# Patient Record
Sex: Female | Born: 1965 | Race: Black or African American | Hispanic: No | Marital: Married | Smoking: Never smoker
Health system: Southern US, Community
[De-identification: ages and names within clinical notes are randomized; demographics above are authoritative.]

## PROBLEM LIST (undated history)

## (undated) DIAGNOSIS — I10 Essential (primary) hypertension: Secondary | ICD-10-CM

## (undated) DIAGNOSIS — T7840XA Allergy, unspecified, initial encounter: Secondary | ICD-10-CM

## (undated) HISTORY — DX: Essential (primary) hypertension: I10

## (undated) HISTORY — DX: Allergy, unspecified, initial encounter: T78.40XA

---

## 1997-09-01 ENCOUNTER — Inpatient Hospital Stay (HOSPITAL_COMMUNITY): Admission: RE | Admit: 1997-09-01 | Discharge: 1997-09-01 | Payer: Self-pay | Admitting: Obstetrics and Gynecology

## 1997-10-30 ENCOUNTER — Other Ambulatory Visit: Admission: RE | Admit: 1997-10-30 | Discharge: 1997-10-30 | Payer: Self-pay | Admitting: Obstetrics & Gynecology

## 1997-12-27 ENCOUNTER — Other Ambulatory Visit: Admission: RE | Admit: 1997-12-27 | Discharge: 1997-12-27 | Payer: Self-pay | Admitting: Obstetrics and Gynecology

## 1998-01-08 ENCOUNTER — Inpatient Hospital Stay (HOSPITAL_COMMUNITY): Admission: AD | Admit: 1998-01-08 | Discharge: 1998-01-10 | Payer: Self-pay | Admitting: Obstetrics and Gynecology

## 1998-03-04 ENCOUNTER — Encounter (HOSPITAL_COMMUNITY): Admission: RE | Admit: 1998-03-04 | Discharge: 1998-06-02 | Payer: Self-pay | Admitting: Obstetrics and Gynecology

## 1998-06-11 ENCOUNTER — Encounter (HOSPITAL_COMMUNITY): Admission: RE | Admit: 1998-06-11 | Discharge: 1998-09-09 | Payer: Self-pay | Admitting: *Deleted

## 1998-10-03 ENCOUNTER — Encounter (HOSPITAL_COMMUNITY): Admission: RE | Admit: 1998-10-03 | Discharge: 1999-01-01 | Payer: Self-pay | Admitting: *Deleted

## 2002-08-26 ENCOUNTER — Emergency Department (HOSPITAL_COMMUNITY): Admission: EM | Admit: 2002-08-26 | Discharge: 2002-08-26 | Payer: Self-pay | Admitting: Emergency Medicine

## 2009-04-17 ENCOUNTER — Emergency Department (HOSPITAL_COMMUNITY): Admission: EM | Admit: 2009-04-17 | Discharge: 2009-04-17 | Payer: Self-pay | Admitting: Family Medicine

## 2009-04-17 ENCOUNTER — Emergency Department (HOSPITAL_COMMUNITY): Admission: EM | Admit: 2009-04-17 | Discharge: 2009-04-18 | Payer: Self-pay | Admitting: Emergency Medicine

## 2011-05-18 ENCOUNTER — Other Ambulatory Visit: Payer: Self-pay | Admitting: Internal Medicine

## 2011-05-18 DIAGNOSIS — Z1231 Encounter for screening mammogram for malignant neoplasm of breast: Secondary | ICD-10-CM

## 2013-02-12 ENCOUNTER — Other Ambulatory Visit: Payer: Self-pay | Admitting: Obstetrics and Gynecology

## 2013-02-12 DIAGNOSIS — Z1231 Encounter for screening mammogram for malignant neoplasm of breast: Secondary | ICD-10-CM

## 2013-02-27 ENCOUNTER — Ambulatory Visit: Payer: Self-pay

## 2013-10-08 ENCOUNTER — Ambulatory Visit (INDEPENDENT_AMBULATORY_CARE_PROVIDER_SITE_OTHER): Payer: BC Managed Care – PPO | Admitting: Physician Assistant

## 2013-10-08 VITALS — BP 156/98 | HR 88 | Temp 98.3°F | Resp 18 | Ht 67.5 in | Wt 165.4 lb

## 2013-10-08 DIAGNOSIS — H6120 Impacted cerumen, unspecified ear: Secondary | ICD-10-CM

## 2013-10-08 DIAGNOSIS — H9202 Otalgia, left ear: Secondary | ICD-10-CM

## 2013-10-08 DIAGNOSIS — H9209 Otalgia, unspecified ear: Secondary | ICD-10-CM

## 2013-10-08 MED ORDER — HYDROCODONE-ACETAMINOPHEN 5-325 MG PO TABS: 1.0000 | ORAL_TABLET | Freq: Four times a day (QID) | ORAL | Status: DC | PRN

## 2013-10-08 NOTE — Progress Notes (Signed)
   Subjective:    Patient ID: Jo Patterson, female    DOB: Sep 25, 1965, 48 y.o.   MRN: [id number]  HPI 48 year old female presents for evaluation of left ear pain. Symptoms started about 1 week ago and got acutely worse last night.  She has had hx of cerumen impaction in the past but it was "years" ago.  States she has felt like her ear has been "closing up" and she has also had decreased hearing.  Hx of ear infections as a child.  She has been using debrox and trying to irrigate her ear but she has had no relief.  Last night the pain increased significantly.   Denies nasal congestion, sore throat, dizziness, nausea, vomiting, or tinnitus.   She is otherwise doing well with no other concerns today.     Review of Systems  Constitutional: Negative for fever and chills.  HENT: Positive for ear pain (left). Negative for congestion, postnasal drip, rhinorrhea, sinus pressure, sore throat and tinnitus.   Respiratory: Negative for cough.   Gastrointestinal: Negative for nausea and vomiting.  Neurological: Negative for light-headedness and headaches.       Objective:   Physical Exam  Constitutional: She is oriented to person, place, and time. She appears well-developed and well-nourished.  HENT:  Head: Normocephalic and atraumatic.  Right Ear: Hearing, external ear and ear canal normal.  Left Ear: External ear and ear canal normal. Decreased hearing is noted.  Bilateral cerumen impaction. Normal TM on right visualized s/p irrigation.  Unable to removed wax in left ear  Eyes: Conjunctivae are normal.  Neck: Normal range of motion.  Cardiovascular: Normal rate.   Pulmonary/Chest: Effort normal and breath sounds normal.  Neurological: She is alert and oriented to person, place, and time.  Psychiatric: She has a normal mood and affect. Her behavior is normal. Judgment and thought content normal.          Assessment & Plan:  Otalgia of left ear - Plan: HYDROcodone-acetaminophen  (NORCO) 5-325 MG per tablet  Cerumen impaction  Left ear was irrigated for approximately 30 minutes and wax remained in canal. Will send patient home with colace to use tonight and tomorrow morning. Instructed her to RTC in the morning for repeat irrigation attempt. If unsuccessful will refer to ENT.

## 2013-10-08 NOTE — Patient Instructions (Signed)
Discontinue use of Q-tips, hair pins, keys, etc.   To help prevent cerumen impaction: While in the washing hair in the shower, allow soapy water to run into ear canal for a few minutes and then rinse (it dissolves the wax like a dishwasher dissolves grease).   May also use half hydrogen peroxide half water in the shower 2-3 times per week to help rinse out the wax. DO NOT USE 100% HYDROGEN PEROXIDE (this can burn the skin).   Also, several drops of sweet oil can be used to help prevent itching of the canal. If this is not enough to decrease itch, you may put a small amount of OTC hydrocortisone cream on pinky finger and apply to portion of canal that can be reached with tip of finger.  Recommend OTC Debrox or Colace to prevent impaction.  

## 2013-10-09 ENCOUNTER — Ambulatory Visit (INDEPENDENT_AMBULATORY_CARE_PROVIDER_SITE_OTHER): Payer: BC Managed Care – PPO | Admitting: Physician Assistant

## 2013-10-09 DIAGNOSIS — H6120 Impacted cerumen, unspecified ear: Secondary | ICD-10-CM

## 2013-10-09 DIAGNOSIS — H9202 Otalgia, left ear: Secondary | ICD-10-CM

## 2013-10-09 DIAGNOSIS — H9209 Otalgia, unspecified ear: Secondary | ICD-10-CM

## 2013-10-09 NOTE — Progress Notes (Signed)
Patient returned this morning for repeat irrigation of left ear after unsuccessful attempt last night. She used colace last night once she got home and again this morning.  Left ear irrigated successfully today and patient reports resolution of symptoms.  No pain, dizziness, or drainage from her ear.  On exam her TM is normal and she has no erythema or debris in the canal. Ear care instructions provided. Follow up as needed.

## 2013-10-24 ENCOUNTER — Ambulatory Visit (INDEPENDENT_AMBULATORY_CARE_PROVIDER_SITE_OTHER): Payer: BC Managed Care – PPO | Admitting: Physician Assistant

## 2013-10-24 VITALS — BP 142/96 | HR 74 | Temp 99.4°F | Resp 16 | Ht 67.5 in | Wt 169.6 lb

## 2013-10-24 DIAGNOSIS — R059 Cough, unspecified: Secondary | ICD-10-CM

## 2013-10-24 DIAGNOSIS — J988 Other specified respiratory disorders: Secondary | ICD-10-CM

## 2013-10-24 DIAGNOSIS — R05 Cough: Secondary | ICD-10-CM

## 2013-10-24 DIAGNOSIS — J22 Unspecified acute lower respiratory infection: Secondary | ICD-10-CM

## 2013-10-24 MED ORDER — AZITHROMYCIN 250 MG PO TABS: ORAL_TABLET | ORAL | Status: DC

## 2013-10-24 MED ORDER — HYDROCODONE-HOMATROPINE 5-1.5 MG/5ML PO SYRP: 5.0000 mL | ORAL_SOLUTION | Freq: Three times a day (TID) | ORAL | Status: DC | PRN

## 2013-10-24 MED ORDER — GUAIFENESIN ER 1200 MG PO TB12: 1.0000 | ORAL_TABLET | Freq: Two times a day (BID) | ORAL | Status: DC | PRN

## 2013-10-24 NOTE — Progress Notes (Signed)
   Subjective:    Patient ID: Jo Patterson, female    DOB: 1966/05/12, 48 y.o.   MRN: [id number]  HPI 48 year old female presents for evaluation of 4 day history of progressively worsening sore throat, body aches, cough, nasal congestion, PND, and bilateral ear fullness/pressure.  She has been taking OTC cold preparations which have not helped much.  Was planning on seeing her PCP but hey were closed.  Has had subjective chills and aches but no documented fever. Denies SOB, wheezing, chest pain, nausea, vomiting, headache, or dizziness.   Patient is otherwise healthy with no other concerns today She is a Runner, broadcasting/film/videoteacher.      Review of Systems  Constitutional: Positive for chills. Negative for fever.  HENT: Positive for congestion, postnasal drip, rhinorrhea and sinus pressure. Negative for sore throat.   Respiratory: Positive for cough. Negative for chest tightness, shortness of breath and wheezing.   Neurological: Negative for dizziness and headaches.       Objective:   Physical Exam  Constitutional: She is oriented to person, place, and time. She appears well-developed and well-nourished.  HENT:  Head: Normocephalic and atraumatic.  Right Ear: Hearing, tympanic membrane, external ear and ear canal normal.  Left Ear: Hearing, tympanic membrane, external ear and ear canal normal.  Mouth/Throat: Uvula is midline, oropharynx is clear and moist and mucous membranes are normal. No oropharyngeal exudate.  Eyes: Conjunctivae are normal.  Neck: Normal range of motion. Neck supple.  Cardiovascular: Normal rate, regular rhythm and normal heart sounds.   Pulmonary/Chest: Effort normal and breath sounds normal.  Lymphadenopathy:    She has no cervical adenopathy.  Neurological: She is alert and oriented to person, place, and time.  Psychiatric: She has a normal mood and affect. Her behavior is normal. Judgment and thought content normal.          Assessment & Plan:  Lower respiratory  infection - Plan: azithromycin (ZITHROMAX) 250 MG tablet  Cough - Plan: HYDROcodone-homatropine (HYCODAN) 5-1.5 MG/5ML syrup, Guaifenesin (MUCINEX MAXIMUM STRENGTH) 1200 MG TB12  Will treat with Zpack as directed  Hycodan q8hours prn cough.  Mucinex twice daily to help with congestion Increase fluids and rest Follow up if symptoms worsen or fail to improve.

## 2014-08-29 ENCOUNTER — Ambulatory Visit (INDEPENDENT_AMBULATORY_CARE_PROVIDER_SITE_OTHER): Payer: BLUE CROSS/BLUE SHIELD

## 2014-08-29 ENCOUNTER — Ambulatory Visit (INDEPENDENT_AMBULATORY_CARE_PROVIDER_SITE_OTHER): Payer: BLUE CROSS/BLUE SHIELD | Admitting: Emergency Medicine

## 2014-08-29 VITALS — BP 136/80 | HR 91 | Temp 98.6°F | Resp 18 | Ht 67.5 in | Wt 177.6 lb

## 2014-08-29 DIAGNOSIS — M542 Cervicalgia: Secondary | ICD-10-CM

## 2014-08-29 DIAGNOSIS — M545 Low back pain: Secondary | ICD-10-CM

## 2014-08-29 MED ORDER — NAPROXEN SODIUM 550 MG PO TABS: 550.0000 mg | ORAL_TABLET | Freq: Two times a day (BID) | ORAL | Status: AC

## 2014-08-29 MED ORDER — CYCLOBENZAPRINE HCL 10 MG PO TABS: 10.0000 mg | ORAL_TABLET | Freq: Three times a day (TID) | ORAL | Status: DC | PRN
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
## 2014-08-29 NOTE — Patient Instructions (Signed)
cervCervical Sprain A cervical sprain is an injury in the neck in which the strong, fibrous tissues (ligaments) that connect your neck bones stretch or tear. Cervical sprains can range from mild to severe. Severe cervical sprains can cause the neck vertebrae to be unstable. This can lead to damage of the spinal cord and can result in serious nervous system problems. The amount of time it takes for a cervical sprain to get better depends on the cause and extent of the injury. Most cervical sprains heal in 1 to 3 weeks. CAUSES  Severe cervical sprains may be caused by:   Contact sport injuries (such as from football, rugby, wrestling, hockey, auto racing, gymnastics, diving, martial arts, or boxing).   Motor vehicle collisions.   Whiplash injuries. This is an injury from a sudden forward and backward whipping movement of the head and neck.  Falls.  Mild cervical sprains may be caused by:   Being in an awkward position, such as while cradling a telephone between your ear and shoulder.   Sitting in a chair that does not offer proper support.   Working at a poorly Marketing executivedesigned computer station.   Looking up or down for long periods of time.  SYMPTOMS   Pain, soreness, stiffness, or a burning sensation in the front, back, or sides of the neck. This discomfort may develop immediately after the injury or slowly, 24 hours or more after the injury.   Pain or tenderness directly in the middle of the back of the neck.   Shoulder or upper back pain.   Limited ability to move the neck.   Headache.   Dizziness.   Weakness, numbness, or tingling in the hands or arms.   Muscle spasms.   Difficulty swallowing or chewing.   Tenderness and swelling of the neck.  DIAGNOSIS  Most of the time your health care provider can diagnose a cervical sprain by taking your history and doing a physical exam. Your health care provider will ask about previous neck injuries and any known neck  problems, such as arthritis in the neck. X-rays may be taken to find out if there are any other problems, such as with the bones of the neck. Other tests, such as a CT scan or MRI, may also be needed.  TREATMENT  Treatment depends on the severity of the cervical sprain. Mild sprains can be treated with rest, keeping the neck in place (immobilization), and pain medicines. Severe cervical sprains are immediately immobilized. Further treatment is done to help with pain, muscle spasms, and other symptoms and may include:  Medicines, such as pain relievers, numbing medicines, or muscle relaxants.   Physical therapy. This may involve stretching exercises, strengthening exercises, and posture training. Exercises and improved posture can help stabilize the neck, strengthen muscles, and help stop symptoms from returning.  HOME CARE INSTRUCTIONS   Put ice on the injured area.   Put ice in a plastic bag.   Place a towel between your skin and the bag.   Leave the ice on for 15-20 minutes, 3-4 times a day.   If your injury was severe, you may have been given a cervical collar to wear. A cervical collar is a two-piece collar designed to keep your neck from moving while it heals.  Do not remove the collar unless instructed by your health care provider.  If you have long hair, keep it outside of the collar.  Ask your health care provider before making any adjustments to your collar. Minor  adjustments may be required over time to improve comfort and reduce pressure on your chin or on the back of your head.  Ifyou are allowed to remove the collar for cleaning or bathing, follow your health care provider's instructions on how to do so safely.  Keep your collar clean by wiping it with mild soap and water and drying it completely. If the collar you have been given includes removable pads, remove them every 1-2 days and hand wash them with soap and water. Allow them to air dry. They should be completely  dry before you wear them in the collar.  If you are allowed to remove the collar for cleaning and bathing, wash and dry the skin of your neck. Check your skin for irritation or sores. If you see any, tell your health care provider.  Do not drive while wearing the collar.   Only take over-the-counter or prescription medicines for pain, discomfort, or fever as directed by your health care provider.   Keep all follow-up appointments as directed by your health care provider.   Keep all physical therapy appointments as directed by your health care provider.   Make any needed adjustments to your workstation to promote good posture.   Avoid positions and activities that make your symptoms worse.   Warm up and stretch before being active to help prevent problems.  SEEK MEDICAL CARE IF:   Your pain is not controlled with medicine.   You are unable to decrease your pain medicine over time as planned.   Your activity level is not improving as expected.  SEEK IMMEDIATE MEDICAL CARE IF:   You develop any bleeding.  You develop stomach upset.  You have signs of an allergic reaction to your medicine.   Your symptoms get worse.   You develop new, unexplained symptoms.   You have numbness, tingling, weakness, or paralysis in any part of your body.  MAKE SURE YOU:   Understand these instructions.  Will watch your condition.  Will get help right away if you are not doing well or get worse. Document Released: 05/09/2007 Document Revised: 07/17/2013 Document Reviewed: 01/17/2013 [facility] Patient Information 2015 [place], [place]. This information is not intended to replace advice given to you by your health care provider. Make sure you discuss any questions you have with your health care provider.

## 2017-02-28 ENCOUNTER — Encounter (HOSPITAL_COMMUNITY): Payer: Self-pay | Admitting: Emergency Medicine

## 2017-02-28 ENCOUNTER — Emergency Department (HOSPITAL_COMMUNITY)
Admission: EM | Admit: 2017-02-28 | Discharge: 2017-02-28 | Discharge status: Against Medical Advice | Payer: BC Managed Care – PPO | Attending: Emergency Medicine | Admitting: Emergency Medicine

## 2017-02-28 DIAGNOSIS — M79604 Pain in right leg: Secondary | ICD-10-CM | POA: Insufficient documentation

## 2017-02-28 DIAGNOSIS — Z5321 Procedure and treatment not carried out due to patient leaving prior to being seen by health care provider: Secondary | ICD-10-CM | POA: Diagnosis not present

## 2017-02-28 NOTE — ED Triage Notes (Signed)
Pt reports R leg pain present since last week, radiates up to back. Started out as back pain. Ambulatory.

## 2017-02-28 NOTE — ED Notes (Signed)
Pt states that she does not want to wait due to wait times and will go home and try tylenol for pain relief.

## 2017-03-06 ENCOUNTER — Emergency Department (HOSPITAL_COMMUNITY)
Admission: EM | Admit: 2017-03-06 | Discharge: 2017-03-06 | Disposition: A | Discharge status: Home / Self Care | Payer: BC Managed Care – PPO | Attending: Emergency Medicine | Admitting: Emergency Medicine

## 2017-03-06 ENCOUNTER — Encounter (HOSPITAL_COMMUNITY): Payer: Self-pay | Admitting: Emergency Medicine

## 2017-03-06 DIAGNOSIS — I10 Essential (primary) hypertension: Secondary | ICD-10-CM | POA: Diagnosis not present

## 2017-03-06 DIAGNOSIS — Z79899 Other long term (current) drug therapy: Secondary | ICD-10-CM | POA: Insufficient documentation

## 2017-03-06 DIAGNOSIS — M545 Low back pain: Secondary | ICD-10-CM | POA: Diagnosis present

## 2017-03-06 DIAGNOSIS — M5410 Radiculopathy, site unspecified: Secondary | ICD-10-CM | POA: Insufficient documentation

## 2017-03-06 MED ORDER — GABAPENTIN 100 MG PO CAPS: 100.0000 mg | ORAL_CAPSULE | Freq: Two times a day (BID) | ORAL | 0 refills | Status: AC

## 2017-03-06 MED ORDER — LIDOCAINE 5 % EX PTCH: 1.0000 | MEDICATED_PATCH | CUTANEOUS | 0 refills | Status: AC

## 2017-03-06 MED ORDER — DEXAMETHASONE SODIUM PHOSPHATE 10 MG/ML IJ SOLN
10.0000 mg | Freq: Once | INTRAMUSCULAR | Status: AC
  Administered 2017-03-06: 10 mg via INTRAMUSCULAR
  Filled 2017-03-06: qty 1

## 2017-03-06 MED ORDER — GABAPENTIN 300 MG PO CAPS
300.0000 mg | ORAL_CAPSULE | Freq: Once | ORAL | Status: AC
  Administered 2017-03-06: 300 mg via ORAL
  Filled 2017-03-06: qty 1

## 2017-03-06 MED ORDER — METHOCARBAMOL 500 MG PO TABS: 500.0000 mg | ORAL_TABLET | Freq: Two times a day (BID) | ORAL | 0 refills | Status: DC

## 2017-03-06 NOTE — ED Triage Notes (Signed)
Pt c/o back pain with shooting pain down her right leg.

## 2017-03-06 NOTE — Discharge Instructions (Signed)
Take it easy, but do not lay around too much as this may make any stiffness worse.  Antiinflammatory medications: Take 600 mg of ibuprofen every 6 hours or 440 mg (over the counter dose) to 500 mg (prescription dose) of naproxen every 12 hours or for the next 3 days. After this time, these medications may be used as needed for pain. Take these medications with food to avoid upset stomach. Choose only one of these medications, do not take them together.  Tylenol: Should you continue to have additional pain while taking the ibuprofen or naproxen, you may add in tylenol as needed. Your daily total maximum amount of tylenol from all sources should be limited to 4000mg /day for persons without liver problems, or 2000mg /day for those with liver problems. Muscle relaxer: Robaxin is a muscle relaxer and may help loosen stiff muscles. Do not take the Robaxin while driving or performing other dangerous activities. If you take the Robaxin, then stop taking the Flexeril. Neurontin: This medication is made to help with nerve pain.  Lidocaine patches: These are available via either prescription or over-the-counter. The over-the-counter option may be more economical one and are likely just as effective. There are multiple over-the-counter brands, such as Salonpas. Tramadol: Stop taking this medication as it seems to be ineffective. Exercises: Be sure to perform the attached exercises starting with three times a week and working up to performing them daily. This is an essential part of preventing long term problems.   Follow up with the orthopedic specialist, as planned, tomorrow.

## 2017-03-06 NOTE — ED Provider Notes (Signed)
[facility]-EMERGENCY DEPT Provider Note   CSN: [id number] Arrival date & time: 03/06/17  1047  By signing my name below, I, Vista Mink, attest that this documentation has been prepared under the direction and in the presence of Nechuma Boven PA-C.  Electronically Signed: Vista Mink, ED Scribe. 03/06/17. 11:49 AM.   History   Chief Complaint Chief Complaint  Patient presents with  . Back Pain    HPI HPI Comments: Jo Patterson is a 51 y.o. female who presents to the Emergency Department complaining of gradual onset, right lower back pain with associated pain down right lower extremity that started approximately one week ago.  Pt started to experience lower back pain after helping her daughter move last week. She denies any specific injury but believes she may have lifted a box incorrectly or overexerted herself. Pt described this pain similar to a "muscle strain." She presented to an Urgent Care on 02/28/17, six days ago, and was given Rx for Flexeril 5mg , Tramadol 50mg  and a 9 day taper of Prednisone. She denies any significant relief with these medications and states that she has been taking them as directed. She has an appointment with orthopedist Dr. Farris Has tomorrow but presents here today due to persistent pain.   Since being seen at the Urgent Care, her right lower back pain has improved but reports a persistent pain radiating down anterior and lateral right leg. She is able to ambulate but with increased difficulty due to pain. She also notes mild tingling that is intermittent to the dorsal aspect of her right foot, predominantly with tingling to her second and third toes. Pt has PCP follow up in four days. She also notes that she has been taking Ibuprofen q6h with minimal relief. Denies numbness, weakness, falls/trauma, changes in bowel or bladder function, or any other complaints.    The history is provided by the patient. No language interpreter was used.    Past Medical History:    Diagnosis Date  . Allergy   . Hypertension     There are no active problems to display for this patient.   Past Surgical History:  Procedure Laterality Date  . CESAREAN SECTION      OB History    No data available       Home Medications    Prior to Admission medications   Medication Sig Start Date End Date Taking? Authorizing Provider  gabapentin (NEURONTIN) 100 MG capsule Take 1 capsule (100 mg total) by mouth 2 (two) times daily. 03/06/17   Matas Burrows C, PA-C  hydrochlorothiazide (HYDRODIURIL) 12.5 MG tablet Take 12.5 mg by mouth daily.    [provider]  lidocaine (LIDODERM) 5 % Place 1 patch onto the skin daily. Remove & Discard patch within 12 hours or as directed by MD 03/06/17   Teejay Meader C, PA-C  methocarbamol (ROBAXIN) 500 MG tablet Take 1 tablet (500 mg total) by mouth 2 (two) times daily. 03/06/17   Nannie Starzyk, Hillard Danker, PA-C    Family History No family history on file.  Social History Social History  Substance Use Topics  . Smoking status: Never Smoker  . Smokeless tobacco: Never Used  . Alcohol use No     Allergies   Patient has no known allergies.   Review of Systems Review of Systems  Constitutional: Negative for chills and fever.  Genitourinary: Negative for frequency and urgency.  Musculoskeletal: Positive for back pain and gait problem (due to pain).  Neurological: Negative for weakness and numbness.  Lower extremity tingling  All other systems reviewed and are negative.    Physical Exam Updated Vital Signs BP (!) 131/97 (BP Location: Left Arm)   Pulse 94   Temp 98.2 F (36.8 C) (Oral)   Resp 16   Ht 5\' 7"  (1.702 m)   Wt 175 lb (79.4 kg)   LMP 02/15/2017 (Approximate)   SpO2 96%   BMI 27.41 kg/m   Physical Exam  Constitutional: She is oriented to person, place, and time. She appears well-developed and well-nourished. No distress.  HENT:  Head: Normocephalic and atraumatic.  Eyes: Conjunctivae are normal.  Neck: Neck  supple.  Cardiovascular: Normal rate, regular rhythm and intact distal pulses.   Pulmonary/Chest: Effort normal.  Musculoskeletal: Normal range of motion. She exhibits tenderness. She exhibits no edema or deformity.  Minor tenderness to the right buttocks. ROM in the ankle knee and hip on the right is fully intact.   Neurological: She is alert and oriented to person, place, and time.  No sensory deficits noted. Strength in the right ankle, knee and hip 5/5. Ambulatory without assistance but with antalgic gate.  Skin: Skin is warm and dry. Capillary refill takes less than 2 seconds. She is not diaphoretic.  Psychiatric: She has a normal mood and affect. Her behavior is normal.  Nursing note and vitals reviewed.    ED Treatments / Results  DIAGNOSTIC STUDIES: Oxygen Saturation is 96% on RA, normal by my interpretation.  COORDINATION OF CARE: 11:31 AM-Discussed treatment plan with pt at bedside and pt agreed to plan.   Labs (all labs ordered are listed, but only abnormal results are displayed) Labs Reviewed - No data to display  EKG  EKG Interpretation None       Radiology No results found.  Procedures Procedures (including critical care time)  Medications Ordered in ED Medications  dexamethasone (DECADRON) injection 10 mg (10 mg Intramuscular Given 03/06/17 1136)  gabapentin (NEURONTIN) capsule 300 mg (300 mg Oral Given 03/06/17 1137)     Initial Impression / Assessment and Plan / ED Course  I have reviewed the triage vital signs and the nursing notes.  Pertinent labs & imaging results that were available during my care of the patient were reviewed by me and considered in my medical decision making (see chart for details).     Patient presents with pain in the buttocks and right lower extremity. No signs of cauda equina or other significant neurologic emergency. Pain seems to be radicular in nature. Patient already has appropriate close follow-up. The patient was given  instructions for home care as well as return precautions. Patient voices understanding of these instructions, accepts the plan, and is comfortable with discharge.  Final Clinical Impressions(s) / ED Diagnoses   Final diagnoses:  Radicular pain of right lower extremity    New Prescriptions Discharge Medication List as of 03/06/2017 11:38 AM    START taking these medications   Details  gabapentin (NEURONTIN) 100 MG capsule Take 1 capsule (100 mg total) by mouth 2 (two) times daily., Starting Sun 03/06/2017, Print    lidocaine (LIDODERM) 5 % Place 1 patch onto the skin daily. Remove & Discard patch within 12 hours or as directed by MD, Starting Sun 03/06/2017, Print    methocarbamol (ROBAXIN) 500 MG tablet Take 1 tablet (500 mg total) by mouth 2 (two) times daily., Starting Sun 03/06/2017, Print       I personally performed the services described in this documentation, which was scribed in my presence. The  recorded information has been reviewed and is accurate.   Anselm Pancoast, PA-C 03/06/17 1210    Doug Sou, MD 03/06/17 1714

## 2017-03-14 ENCOUNTER — Other Ambulatory Visit: Payer: Self-pay | Admitting: Sports Medicine

## 2017-03-14 DIAGNOSIS — M5440 Lumbago with sciatica, unspecified side: Secondary | ICD-10-CM

## 2017-03-20 ENCOUNTER — Ambulatory Visit
Admission: RE | Admit: 2017-03-20 | Discharge: 2017-03-20 | Disposition: A | Discharge status: Home / Self Care | Payer: BC Managed Care – PPO | Source: Ambulatory Visit | Attending: Sports Medicine | Admitting: Sports Medicine

## 2017-03-20 DIAGNOSIS — M5440 Lumbago with sciatica, unspecified side: Secondary | ICD-10-CM

## 2017-03-23 ENCOUNTER — Other Ambulatory Visit: Payer: Self-pay | Admitting: Sports Medicine

## 2017-03-23 DIAGNOSIS — M25551 Pain in right hip: Secondary | ICD-10-CM

## 2017-04-07 ENCOUNTER — Ambulatory Visit
Admission: RE | Admit: 2017-04-07 | Discharge: 2017-04-07 | Disposition: A | Discharge status: Home / Self Care | Payer: BC Managed Care – PPO | Source: Ambulatory Visit | Attending: Sports Medicine | Admitting: Sports Medicine

## 2017-04-07 DIAGNOSIS — M25551 Pain in right hip: Secondary | ICD-10-CM

## 2017-04-07 MED ORDER — IOPAMIDOL (ISOVUE-M 200) INJECTION 41%
15.0000 mL | Freq: Once | INTRAMUSCULAR | Status: AC
  Administered 2017-04-07: 15 mL via INTRA_ARTICULAR

## 2017-10-12 ENCOUNTER — Other Ambulatory Visit: Payer: Self-pay | Admitting: Internal Medicine

## 2017-10-12 DIAGNOSIS — E2839 Other primary ovarian failure: Secondary | ICD-10-CM

## 2019-05-21 IMAGING — MR MR HIP*R* W/CM
5 of 6 series · 27 of 40 positions shown · IV contrast (multihance)
Comparison: None.

CLINICAL DATA: Right hip and leg pain with numbness and weakness
starting in early [REDACTED] and worsening over time.

EXAM:
MRI OF THE RIGHT HIP WITH CONTRAST
TECHNIQUE: Multiplanar, multisequence MR imaging was performed following the
administration of intravenous contrast.
CONTRAST:  0.05 mL MultiHance mixed with 5 mL of 1% lidocaine and 15
mL of Isovue N-NII was injected into the right hip under separate
procedure for a total of 12 mL.

[Series 9: T2 fat-sat · coronal · right · 3.0mm · 0.78mm/px · 7 of 24 slices shown]
[im 1/24]
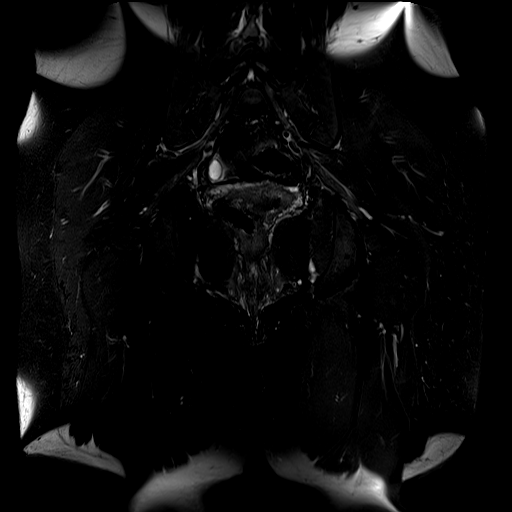
[im 4/24]
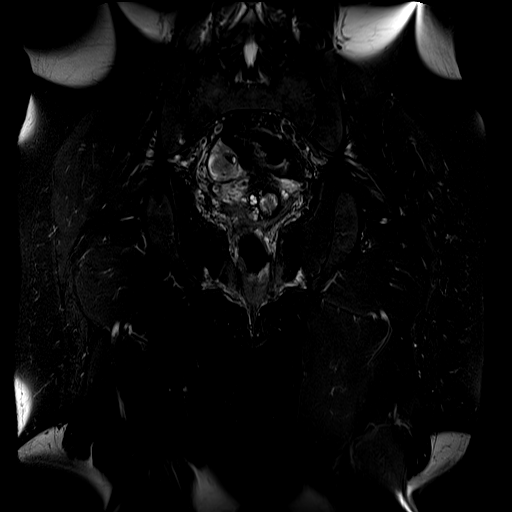
[im 8/24]
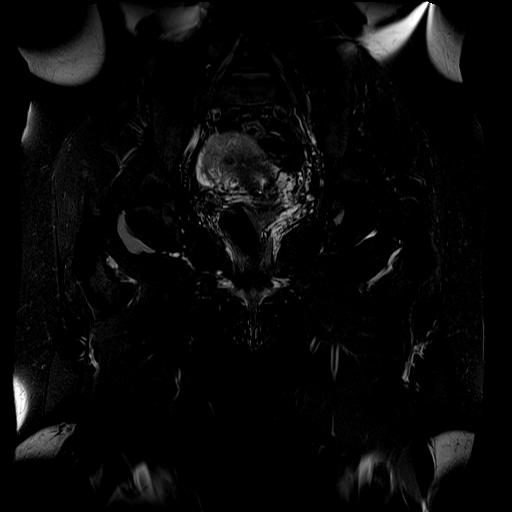
[im 12/24]
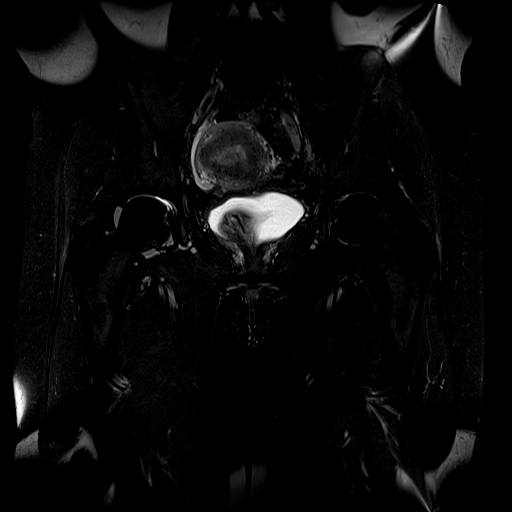
[im 16/24]
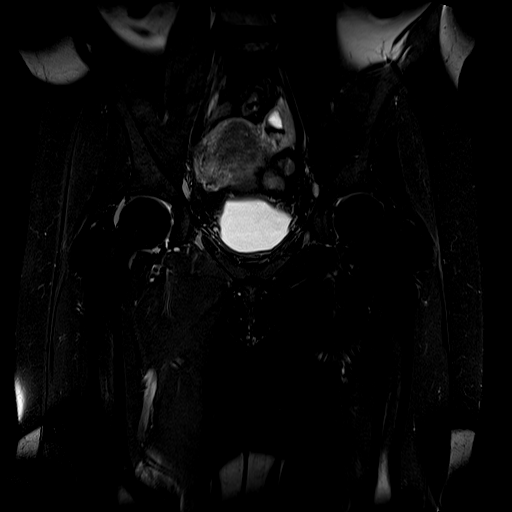
[im 20/24]
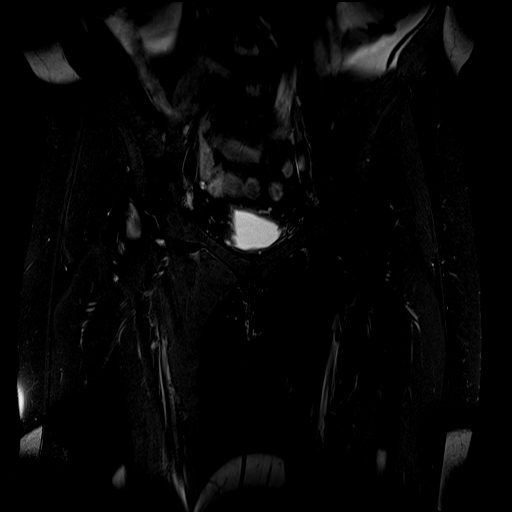
[im 24/24]
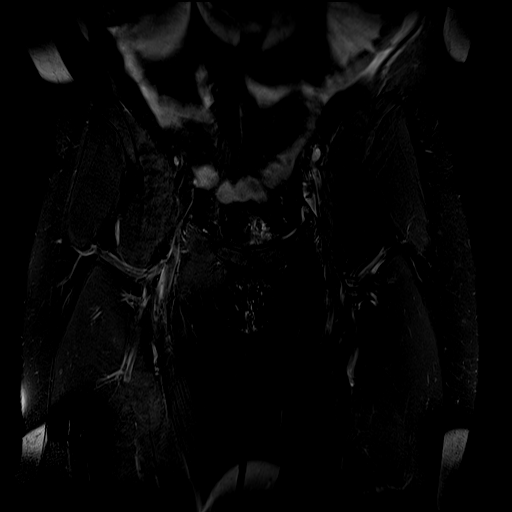

[Series 10: T1 · coronal · right · 3.0mm · 0.78mm/px · 6 of 24 slices shown]
[im 1/24]
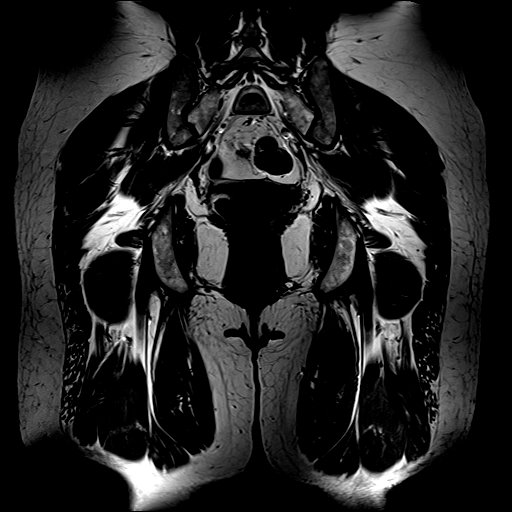
[im 5/24]
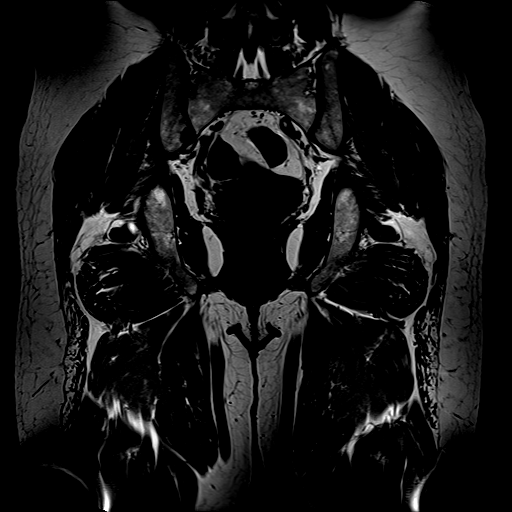
[im 10/24]
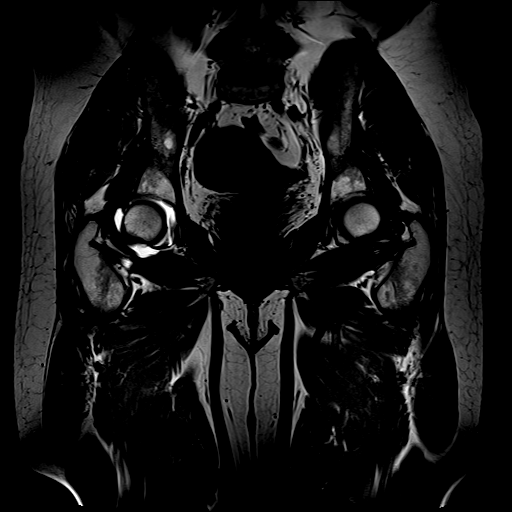
[im 14/24]
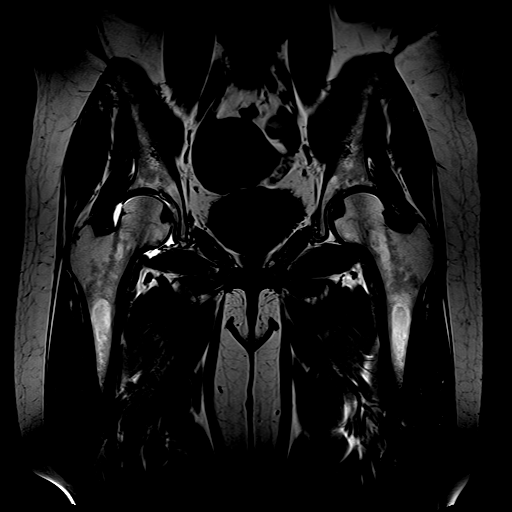
[im 19/24]
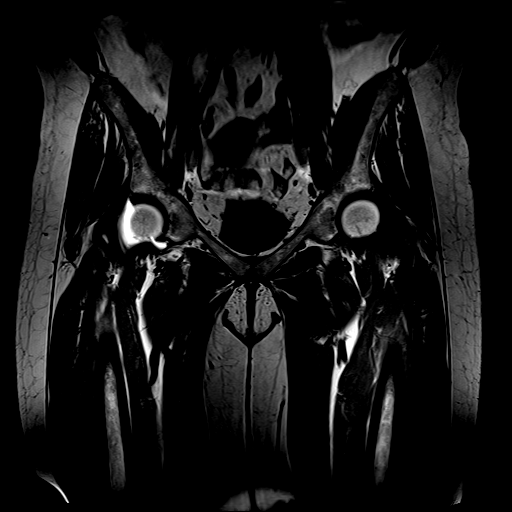
[im 24/24]
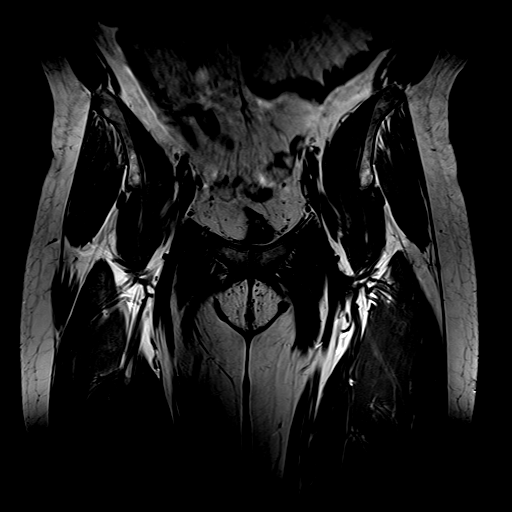

[Series 11: T1 fat-sat · axial · right · 3.0mm · 0.56mm/px · z∈[-31,+68]mm · 7 of 31 slices shown (1 of 3)]
[im 1/31]
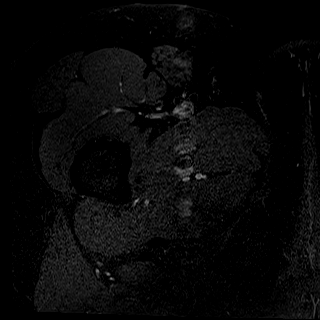
[im 6/31]
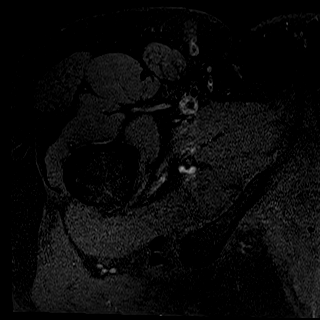
[im 11/31]
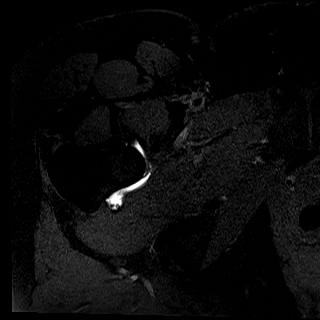
[im 16/31]
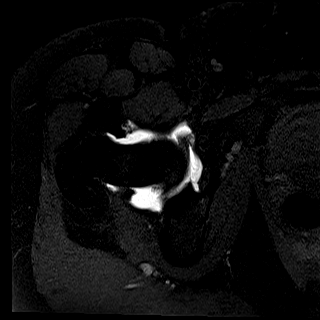
[im 21/31]
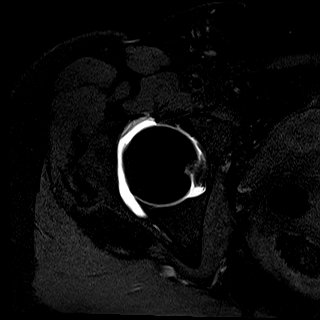
[im 26/31]
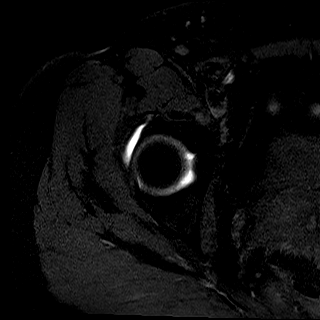
[im 31/31]
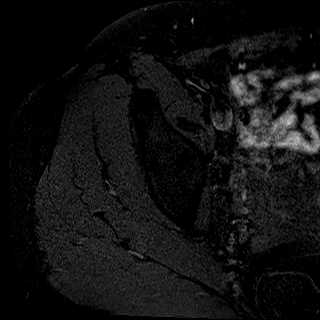

[Series 12: T1 fat-sat · coronal · right · 3.2mm · 0.56mm/px · 6 of 25 slices shown (2 of 3)]
[im 1/25]
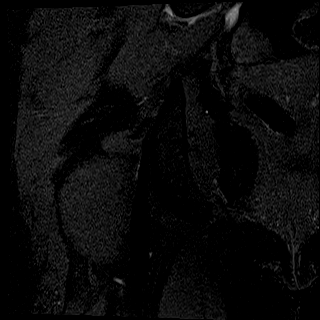
[im 5/25]
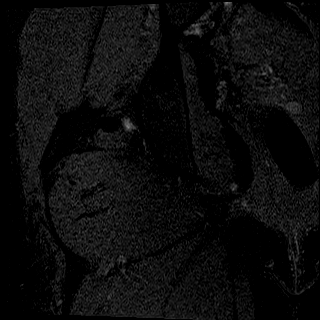
[im 10/25]
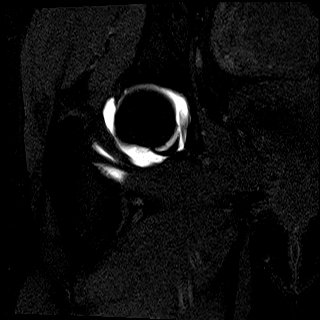
[im 15/25]
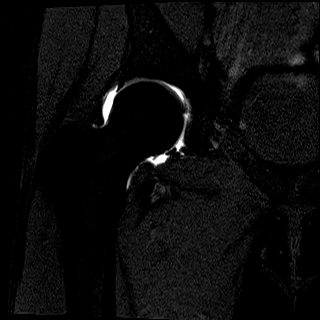
[im 20/25]
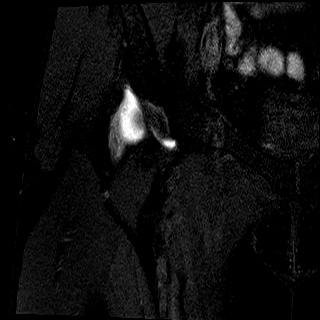
[im 25/25]
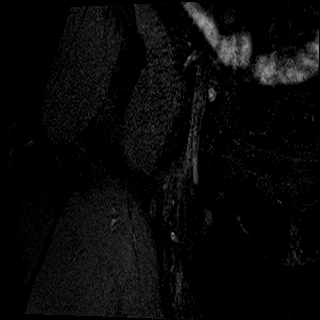

[Series 13: T1 fat-sat · sagittal · right · 3.2mm · 0.56mm/px · 1 of 31 slices shown (3 of 3)]
[im 1/31]
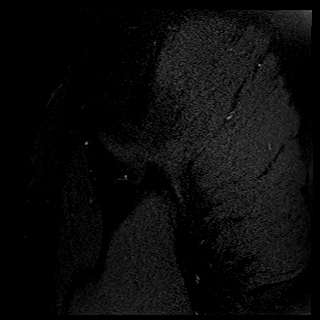

[27 of 40 positions shown; findings below may reference images not displayed]

FINDINGS: Bones: No fracture, dislocation or marrow signal abnormalities about
the right hip. No acute nor suspicious osseous abnormality of the
bony pelvis and contralateral left hip. Intact bilateral sacroiliac
joints without evidence of sacroiliitis. The included lower lumbar
spine is unremarkable.

Articular cartilage and labrum

Articular cartilage: No focal chondral defects. Mild chondral
thinning of the femoral head and acetabular cartilage.

Labrum: No detached labral tear. Normal sublabral recess
anterosuperiorly.

Joint or bursal effusion

Joint effusion: Right hip joint distention with contrast. No
contralateral left hip joint effusion.

Bursae:  None

Muscles and tendons

Muscles and tendons:  Negative

Other findings

Miscellaneous: No pelvic adenopathy. Unremarkable appearance of the
uterus and adnexa. No bowel inflammation or obstruction. No free
free fluid.
IMPRESSION: 1. Mild chondral thinning of the right hip.
2. No labral tear, intra-articular loose body or suspicious osseous
abnormalities.

## 2019-09-22 ENCOUNTER — Ambulatory Visit: Payer: BC Managed Care – PPO | Attending: Internal Medicine

## 2019-09-22 DIAGNOSIS — Z23 Encounter for immunization: Secondary | ICD-10-CM

## 2019-09-22 NOTE — Progress Notes (Signed)
   Covid-19 Vaccination Clinic  Name:  Jo Patterson    MRN: [id number] DOB: 12-14-1965  09/22/2019  [name] was observed post Covid-19 immunization for 15 minutes without incidence. She was provided with Vaccine Information Sheet and instruction to access the V-Safe system.   [name] was instructed to call 911 with any severe reactions post vaccine: Marland Kitchen Difficulty breathing  . Swelling of your face and throat  . A fast heartbeat  . A bad rash all over your body  . Dizziness and weakness    Immunizations Administered    Name Date Dose VIS Date Route   Pfizer COVID-19 Vaccine 09/22/2019  9:55 AM 0.3 mL 07/06/2019 Intramuscular   Manufacturer: [employer], [employer]   Lot: [id number]   NDC: [address]-1

## 2019-10-17 ENCOUNTER — Ambulatory Visit: Payer: BC Managed Care – PPO | Attending: Internal Medicine

## 2019-10-17 DIAGNOSIS — Z23 Encounter for immunization: Secondary | ICD-10-CM

## 2019-10-17 NOTE — Progress Notes (Signed)
   Covid-19 Vaccination Clinic  Name:  Jo Patterson    MRN: [id number] DOB: 09/24/1965  10/17/2019  [name] was observed post Covid-19 immunization for 15 minutes without incident. She was provided with Vaccine Information Sheet and instruction to access the V-Safe system.   [name] was instructed to call 911 with any severe reactions post vaccine: Marland Kitchen Difficulty breathing  . Swelling of face and throat  . A fast heartbeat  . A bad rash all over body  . Dizziness and weakness   Immunizations Administered    Name Date Dose VIS Date Route   Pfizer COVID-19 Vaccine 10/17/2019 10:14 AM 0.3 mL 07/06/2019 Intramuscular   Manufacturer: [employer], [employer]   Lot: [id number]   NDC: [address]-5

## 2019-11-02 ENCOUNTER — Other Ambulatory Visit: Payer: Self-pay | Admitting: Internal Medicine

## 2019-11-02 DIAGNOSIS — E2839 Other primary ovarian failure: Secondary | ICD-10-CM

## 2020-08-19 ENCOUNTER — Other Ambulatory Visit: Payer: BC Managed Care – PPO

## 2020-08-19 ENCOUNTER — Other Ambulatory Visit: Payer: Self-pay

## 2020-08-19 DIAGNOSIS — Z20822 Contact with and (suspected) exposure to covid-19: Secondary | ICD-10-CM

## 2020-08-20 LAB — SARS-COV-2, NAA 2 DAY TAT

## 2020-08-20 LAB — NOVEL CORONAVIRUS, NAA: SARS-CoV-2, NAA: NOT DETECTED

## 2020-08-23 ENCOUNTER — Encounter: Payer: Self-pay | Admitting: Nurse Practitioner

## 2020-08-23 ENCOUNTER — Telehealth: Payer: BC Managed Care – PPO | Admitting: Nurse Practitioner

## 2020-08-23 DIAGNOSIS — Z20822 Contact with and (suspected) exposure to covid-19: Secondary | ICD-10-CM | POA: Diagnosis not present

## 2020-08-23 DIAGNOSIS — F419 Anxiety disorder, unspecified: Secondary | ICD-10-CM | POA: Insufficient documentation

## 2020-08-23 NOTE — Progress Notes (Addendum)
Virtual Visit Progress Note  [name],you are scheduled for a virtual visit with your provider today.    Just as we do with appointments in the office, we must obtain your consent to participate.  Your consent will be active for this visit and any virtual visit you may have with one of our providers in the next 365 days.    If you have a MyChart account, I can also send a copy of this consent to you electronically.  All virtual visits are billed to your insurance company just like a traditional visit in the office.  As this is a virtual visit, video technology does not allow for your provider to perform a traditional examination.  This may limit your provider's ability to fully assess your condition.  If your provider identifies any concerns that need to be evaluated in person or the need to arrange testing such as labs, EKG, etc, we will make arrangements to do so.    Although advances in technology are sophisticated, we cannot ensure that it will always work on either your end or our end.  If the connection with a video visit is poor, we may have to switch to a telephone visit.  With either a video or telephone visit, we are not always able to ensure that we have a secure connection.   I need to obtain your verbal consent now.   Are you willing to proceed with your visit today?   Jo Patterson has provided verbal consent on 08/23/2020 for a virtual visit (video or telephone).   Alinda Dooms, NP 08/23/2020  7:26 PM    I connected with Jo Patterson on 08/23/20 at  7:30 PM EST by video enabled telemedicine visit and verified that I am speaking with the correct person using two identifiers.   I discussed the limitations, risks, security and privacy concerns of performing an evaluation and management service by telemedicine and the availability of in-person appointments. I also discussed with the patient that there may be a patient responsible charge related to this service. The  patient expressed understanding and agreed to proceed.   Other persons participating in the visit and their role in the encounter: none   Patient's location: home Provider's location: home  Chief Complaint: covid test results    Patient Care Team: Fleet Contras, MD as PCP - General (Internal Medicine)   Name of the patient: Jo Patterson  [id number]  [birth year]   Date of visit: 08/23/20  Chief complaint/ Reason for visit- lab result  History of Presenting Illness- Patient is 55 year old female who presents today questioning results of her covid test that she took at [facility] earlier this week. Her son had tested positive week prior and she developed symptoms of chills, sore throat, congestion, and nausea. 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 Other family members were tested at same time and have already received their results.   Review of systems- Review of Systems  Constitutional: Positive for chills. Negative for fever, malaise/fatigue and weight loss.  HENT: Positive for sore throat. Negative for hearing loss, nosebleeds and tinnitus.   Eyes: Negative for blurred vision and double vision.  Respiratory: Negative for cough, hemoptysis, shortness of breath and wheezing.   Cardiovascular: Negative for chest pain, palpitations and leg swelling.  Gastrointestinal: Positive for nausea. Negative for abdominal pain, blood in stool, constipation, diarrhea, melena and vomiting.  Genitourinary: Negative for dysuria and urgency.  Musculoskeletal:  Negative for back pain, falls, joint pain and myalgias.  Skin: Negative for itching and rash.  Neurological: Negative for dizziness, tingling, sensory change, loss of consciousness, weakness and headaches.  Endo/Heme/Allergies: Negative for environmental allergies. Does not bruise/bleed easily.  Psychiatric/Behavioral: Negative for depression. The patient is not nervous/anxious and does not have insomnia.      No  Known Allergies  Past Medical History:  Diagnosis Date  . Allergy   . Hypertension     Past Surgical History:  Procedure Laterality Date  . CESAREAN SECTION      Social History   Socioeconomic History  . Marital status: Married    Spouse name: Not on file  . Number of children: Not on file  . Years of education: Not on file  . Highest education level: Not on file  Occupational History  . Not on file  Tobacco Use  . Smoking status: Never Smoker  . Smokeless tobacco: Never Used  Substance and Sexual Activity  . Alcohol use: No  . Drug use: No  . Sexual activity: Not on file  Other Topics Concern  . Not on file  Social History Narrative  . Not on file   Social Determinants of Health   Financial Resource Strain: Not on file  Food Insecurity: Not on file  Transportation Needs: Not on file  Physical Activity: Not on file  Stress: Not on file  Social Connections: Not on file  Intimate Partner Violence: Not on file    Immunization History  Administered Date(s) Administered  . PFIZER(Purple Top)SARS-COV-2 Vaccination 09/22/2019, 10/17/2019    No family history on file.   Current Outpatient Medications:  .  gabapentin (NEURONTIN) 100 MG capsule, Take 1 capsule (100 mg total) by mouth 2 (two) times daily., Disp: 14 capsule, Rfl: 0 .  hydrochlorothiazide (HYDRODIURIL) 12.5 MG tablet, Take 12.5 mg by mouth daily., Disp: , Rfl:  .  lidocaine (LIDODERM) 5 %, Place 1 patch onto the skin daily. Remove & Discard patch within 12 hours or as directed by MD, Disp: 30 patch, Rfl: 0 .  methocarbamol (ROBAXIN) 500 MG tablet, Take 1 tablet (500 mg total) by mouth 2 (two) times daily., Disp: 20 tablet, Rfl: 0  Physical exam: Exam limited due to telemedicine Physical Exam Constitutional:      General: She is not in acute distress. HENT:     Head: Normocephalic.  Pulmonary:     Effort: No respiratory distress.     Comments: Speaking in full sentences at normal volume without  visible shortness of breath.  Neurological:     Mental Status: She is alert and oriented to person, place, and time.  Psychiatric:        Mood and Affect: Mood normal.        Behavior: Behavior normal.       Assessment and plan- Patient is a 55 y.o. female with covid exposure who presents for discussion of recent covid pcr results. Test pending. Will check with lab to follow up on results. Advised may need to re test and recommended [place] covid test finder website.   1) Close exposure to COVID-19 virus  - Keep yourself hydrated with a lot of water and rest. -Take dextromethorphan for cough and Mucinex to thin secretions - Take Tylenol or pain reliever every 4-6 hours as needed for pain/fever/body ache. Don't exceed 3000 mg in 24 hour period - Reviewed symptoms that would warrant ER or hospital care.  - Encouraged patient to monitor oxygen level  with pulse oximeter, how to troubleshoot, and to seek ER care for SpO2 < 94%.  - CDC criteria for quarantine and isolation reviewed.     2) Chest congestion  - Flonase or nasocort as needed. Afrin for nasal congestion no longer than 5 days.   Follow up with PCP.    Visit Diagnosis 1. Close exposure to COVID-19 virus     Patient expressed understanding and was in agreement with this plan. She also understands that She can call clinic at any time with any questions, concerns, or complaints.   I discussed the assessment and treatment plan with the patient. The patient was provided an opportunity to ask questions and all were answered. The patient agreed with the plan and demonstrated an understanding of the instructions.   The patient was advised to call back or seek an in-person evaluation if the symptoms worsen or if the condition fails to improve as anticipated.   I provided 15 minutes of face-to-face video visit time dedicated to the care of this patient on the date of this encounter to include pre-visit review of lab results,  face-to-face time with the patient, and post visit ordering of testing/documentation.   Thank you for allowing me to participate in the care of this very pleasant patient.   Consuello Masse, DNP, AGNP-C [facility] [phone] (clinic)

## 2020-08-24 NOTE — Addendum Note (Signed)
Addended by: Alinda Dooms on: 08/24/2020 06:56 PM   Modules accepted: Level of Service

## 2020-08-25 ENCOUNTER — Other Ambulatory Visit: Payer: Self-pay | Admitting: Internal Medicine

## 2020-08-26 LAB — CBC
HCT: 40.7 % (ref 35.0–45.0)
Hemoglobin: 13.3 g/dL (ref 11.7–15.5)
MCH: 28.4 pg (ref 27.0–33.0)
MCHC: 32.7 g/dL (ref 32.0–36.0)
MCV: 86.8 fL (ref 80.0–100.0)
MPV: 9.9 fL (ref 7.5–12.5)
Platelets: 417 10*3/uL — ABNORMAL HIGH (ref 140–400)
RBC: 4.69 10*6/uL (ref 3.80–5.10)
RDW: 13 % (ref 11.0–15.0)
WBC: 4.4 10*3/uL (ref 3.8–10.8)

## 2020-08-26 LAB — COMPLETE METABOLIC PANEL WITH GFR
AG Ratio: 1.4 (calc) (ref 1.0–2.5)
ALT: 13 U/L (ref 6–29)
AST: 18 U/L (ref 10–35)
Albumin: 4.4 g/dL (ref 3.6–5.1)
Alkaline phosphatase (APISO): 90 U/L (ref 37–153)
BUN: 17 mg/dL (ref 7–25)
CO2: 25 mmol/L (ref 20–32)
Calcium: 9.9 mg/dL (ref 8.6–10.4)
Chloride: 101 mmol/L (ref 98–110)
Creat: 0.84 mg/dL (ref 0.50–1.05)
GFR, Est African American: 92 mL/min/{1.73_m2} (ref >=60)
GFR, Est Non African American: 79 mL/min/{1.73_m2} (ref >=60)
Globulin: 3.2 g/dL (calc) (ref 1.9–3.7)
Glucose, Bld: 80 mg/dL (ref 65–99)
Potassium: 4.7 mmol/L (ref 3.5–5.3)
Sodium: 139 mmol/L (ref 135–146)
Total Bilirubin: 1.2 mg/dL (ref 0.2–1.2)
Total Protein: 7.6 g/dL (ref 6.1–8.1)

## 2020-08-26 LAB — LIPID PANEL
Cholesterol: 202 mg/dL — ABNORMAL HIGH (ref <200)
HDL: 47 mg/dL — ABNORMAL LOW (ref >=50)
LDL Cholesterol (Calc): 134 mg/dL (calc) — ABNORMAL HIGH
Non-HDL Cholesterol (Calc): 155 mg/dL (calc) — ABNORMAL HIGH (ref <130)
Total CHOL/HDL Ratio: 4.3 (calc) (ref <5.0)
Triglycerides: 100 mg/dL (ref <150)

## 2020-08-26 LAB — TSH: TSH: 1.35 mIU/L

## 2020-08-26 LAB — VITAMIN D 25 HYDROXY (VIT D DEFICIENCY, FRACTURES): Vit D, 25-Hydroxy: 30 ng/mL (ref 30–100)

## 2020-12-09 ENCOUNTER — Other Ambulatory Visit: Payer: Self-pay | Admitting: Internal Medicine

## 2020-12-09 DIAGNOSIS — E2839 Other primary ovarian failure: Secondary | ICD-10-CM

## 2021-02-08 ENCOUNTER — Encounter: Payer: Self-pay | Admitting: Nurse Practitioner

## 2021-06-12 ENCOUNTER — Other Ambulatory Visit: Payer: BC Managed Care – PPO

## 2021-09-21 ENCOUNTER — Other Ambulatory Visit: Payer: Self-pay | Admitting: Internal Medicine

## 2021-09-22 LAB — CBC
HCT: 39.9 % (ref 35.0–45.0)
Hemoglobin: 13.1 g/dL (ref 11.7–15.5)
MCH: 27.9 pg (ref 27.0–33.0)
MCHC: 32.8 g/dL (ref 32.0–36.0)
MCV: 85.1 fL (ref 80.0–100.0)
MPV: 9.7 fL (ref 7.5–12.5)
Platelets: 385 10*3/uL (ref 140–400)
RBC: 4.69 10*6/uL (ref 3.80–5.10)
RDW: 13.2 % (ref 11.0–15.0)
WBC: 5.2 10*3/uL (ref 3.8–10.8)

## 2021-09-22 LAB — TSH: TSH: 1.89 mIU/L

## 2021-09-22 LAB — COMPLETE METABOLIC PANEL WITH GFR
AG Ratio: 1.5 (calc) (ref 1.0–2.5)
ALT: 14 U/L (ref 6–29)
AST: 17 U/L (ref 10–35)
Albumin: 4.8 g/dL (ref 3.6–5.1)
Alkaline phosphatase (APISO): 90 U/L (ref 37–153)
BUN: 16 mg/dL (ref 7–25)
CO2: 25 mmol/L (ref 20–32)
Calcium: 9.9 mg/dL (ref 8.6–10.4)
Chloride: 103 mmol/L (ref 98–110)
Creat: 0.95 mg/dL (ref 0.50–1.03)
Globulin: 3.1 g/dL (calc) (ref 1.9–3.7)
Glucose, Bld: 99 mg/dL (ref 65–99)
Potassium: 4.4 mmol/L (ref 3.5–5.3)
Sodium: 138 mmol/L (ref 135–146)
Total Bilirubin: 0.9 mg/dL (ref 0.2–1.2)
Total Protein: 7.9 g/dL (ref 6.1–8.1)
eGFR: 71 mL/min/{1.73_m2} (ref >=60)

## 2021-09-22 LAB — LIPID PANEL
Cholesterol: 213 mg/dL — ABNORMAL HIGH (ref <200)
HDL: 44 mg/dL — ABNORMAL LOW (ref >=50)
LDL Cholesterol (Calc): 132 mg/dL (calc) — ABNORMAL HIGH
Non-HDL Cholesterol (Calc): 169 mg/dL (calc) — ABNORMAL HIGH (ref <130)
Total CHOL/HDL Ratio: 4.8 (calc) (ref <5.0)
Triglycerides: 232 mg/dL — ABNORMAL HIGH (ref <150)

## 2021-09-22 LAB — VITAMIN D 25 HYDROXY (VIT D DEFICIENCY, FRACTURES): Vit D, 25-Hydroxy: 33 ng/mL (ref 30–100)

## 2022-10-27 ENCOUNTER — Other Ambulatory Visit (HOSPITAL_BASED_OUTPATIENT_CLINIC_OR_DEPARTMENT_OTHER): Payer: Self-pay

## 2022-10-27 MED ORDER — WEGOVY 1 MG/0.5ML ~~LOC~~ SOAJ
1.0000 mg | SUBCUTANEOUS | 2 refills | Status: AC
  Filled 2022-10-27 (×2): qty 2, 28d supply, fill #0
